# Patient Record
Sex: Female | Born: 1955 | Race: White | Hispanic: No
Health system: Northeastern US, Academic
[De-identification: ages and names within clinical notes are randomized; demographics above are authoritative.]

---

## 2020-01-24 ENCOUNTER — Ambulatory Visit: Admit: 2020-01-24 | Payer: PRIVATE HEALTH INSURANCE | Attending: Medical Oncology

## 2020-01-25 DIAGNOSIS — Z853 Personal history of malignant neoplasm of breast: Secondary | ICD-10-CM

## 2020-01-25 NOTE — Progress Notes
Re: Sydney Pope (Oct 05, 1955)MRN: [id number] Provider: Donetta Potts, MDDate of service: 7/14/2021FOLLOWUP NOTEReason for visit: Follow up Completed neoadjuvant chemotherapy (pertuzumab held due to allergic reaction) on clinical trial HIC [id number] with a pathologic complete response. 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 Concerning round lymph node in the left axillary tail also noted. Both biopsied. - 04/28/2013 Biopsy, left breast, 1 o'clock: Infiltrating duct carcinoma, poorly differentiated (tub 3, nuc 3, mit 2), maximum size on one fragment 0.9cm. ER < 1% (Negative), PR < 1% (Negative), AR 60% (1+ to 3+ positive). HER2 3+ by IHC and positive by FISH, ratio 15.0, average HER2 copy number 40.0. Ki-67 35%.- 04/28/2013 Biopsy, left axilla: Fragments of lymph node involved by metastatic carcinoma which is similar in morphology to that seen in breast biopsy, maximum size in one fragment 0.8cm; no extracapsular extension seen. Tumor is GATA3+, S100 negative. - 05/07/2013 Initial surgery visit. - 05/08/2013 Initial medical oncology visit. - 05/16/2013 [place] chest/abd/pelvis: IMPRESSION: 1. Left intramammary mass with associated left axillary lymphadenopathy. 2. 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 Estimated ejection fraction by 3D volumetric analysis is 67 %. Mild diastolic dysfunction. Mild mitral and tricuspid regurgitation. No significant pericardial effusion. No prior studies for comparison. - 05/18/2013 Returns to clinic. Consented to [facility] #[id number], examining paclitaxel + Herceptin + pertuzumab followed by FEC75 + Herceptin + pertuzumab.- 05/22/2013 Cycle 1 Day 1 Taxol/H/P, 05/29/2013 day 8, 06/05/2013 day 15- 06/12/2013 Returns to clinic. Anal mucositis + ? Herpes outbreak, started on Valtrex- 06/12/2013 Cycle 2 Day 1 Taxol/H/P, 06/19/2013 day 8, 06/26/2013 day 15- 07/03/2013 Returns to clinic. Cycle 3 Day 1 Taxol/H/P, day 8 12/29 and day 15 07/17/13- 07/24/2013 Cycle 4 Day 1; had reaction to pertuzumab, requiring overnight observation in the hospital. Did not receive Taxol or Herceptin. - 08/01/2013 Day 8 Taxol/Herceptin, 1/26 Day 15, 2/2 Day 22. Pertuzumab discontinued given concern for future anaphylaxis. - 08/18/2013 Ultrasound: Index mass (biopsied IDC) at left 1:00 has decreased in size. Previously biopsied metastatic left axillary lymph node has slightly decreased in size.- 08/18/2013 Echocardiogram: Normal left ventricular size, wall thickness and systolic function. Estimated ejection fraction estimated by 3D volumetric analysis is 69 %. Mild diastolic dysfunction. Normal right ventricular size and systolic function. Mild left atrial dilatation.Mild mitral, tricuspid and pulmonary regurgitation. No significant pericardial effusion. No significant chance compared to the study dated 05/18/2013.- 08/21/2013 Cycle 1 of FEC/H2/22/14-2/24/14: Inpatient admission for neutropenic fever and mouth sores. -09/11/13: cycle 2 of FEC/H. Maintained dose, added Neulasta.-10/02/13: cycle 3 of FEC/H4/6/15-10/18/13: Admission for toe infection. Needed left total nail avulsion.- 10/23/13: cycle 4 of FEC/H- 11/17/2013 Left lumpectomy/axillary dissection: Complete pathologic response- 02/09/14: completed adjuvant radiation treatment with Dr.McGibbon- 04/30/2014 Last dose of adjuvant Herceptin. INTERIM HISTORYHere for follow up. Just moved to [place]. Generally feeling well. Overall doing well. No breast related issues. No new headaches, visual changes. Due for a new prescription. Vaginal dryness is not an issue with Premarin. She lives in [place] for winter and is  here now for summer. No unusual aches and pains. Mood is good. Otherwise feeling well.Review of SystemsA comprehensive 12-point review of systems was queried and is otherwise negative for fevers, headaches, double vision, dizziness, cough, shortness of breath, chest pain, abdominal pain, nausea/vomiting, urinary symptoms, swelling of extremities. No new onset of bone or joint pain.ECOG Performance Status: 0PAST MEDICAL HX: [name]  has a past medical history of Breast cancer (HC Code), Diverticulitis, and Radiation.PAST SURGICAL HX: She  has a past surgical history that includes Splenectomy, total (1963); Foot surgery; Sinus surgery; and Breast surgery (Left, 11/17/13).ALLERGIES: Perjeta [pertuzumab], Ciprofloxacin, and Flagyl [metronidazole]MEDICATIONS: Current Outpatient Medications: ?  acetaminophen, 650 mg, Oral, Q6H PRN?  black cohosh, Take by mouth.?  CALCIUM CARB/VIT D3/MINERALS (CALCIUM-VITAMIN D ORAL), Take by mouth. Start date 2004?  fluocinonide, fluocinonide 0.05 % topical solution?  multivitamin, 1 tablet, Oral, Daily?  Premarin, ?  Benay Spice, FAMILY HX: Her family history includes BRCA 1/2 in her sister; Breast cancer (age of onset: 61) in her paternal aunt and paternal grandmother; Prostate cancer (age of onset: 89) in her father; Uterine cancer (age of onset: 1) in her maternal grandmother.SOCIAL HX: Sydney Pope  reports that she has never smoked. She has never used smokeless tobacco. She reports current alcohol use. She reports that she does not use drugs.PHYSICAL EXAM:BP: 137/75, Pulse: 61, Temp: 98.1 ?F (36.7 ?C), Temp src: Temporal, Resp: 20, SpO2: 100 %, Pain Score: Zero, Height: 5' 3 (1.6 m), Weight: 58.2 kg, Weight (lbs): 128.3, BMI (Calculated): 22.8General: Well appearing female in no acute distress.Oral mucosa is clear. Sclera anicteric. No oral lesions.No palpable cervical, supraclavicular or infraclavicular lymph nodes.  Lungs: Clear to auscultation.Heart: Regular rate and rhythm.Abdomen: Soft and non tender without any appreciable organomegaly.Extremities: No swelling.Musculoskeletal: No tenderness to spine on palpation.Skin: No evidence of rash in exposed parts of the body.Psychiatric: She has a normal mood and affect. Breasts: No palpable axillary adenopathy.Left breast: No suspicious findings, lumpectomy scar without any suspicious findings. Right breast: No suspicious findings. Data Review:Recent ImagingMammography Screening Tomo Bilateral (bh Yh Sr)Result Date: 04/08/2019#[id number] - MAMMO SCREENING TOMO BILATERAL: 04/07/2019 CLINICAL: Screening, S/P left breast cancer (lumpectomy).  Digital breast tomosynthesis was performed and used in the interpretation of images.  C-View (synthesized 2D) mammography was utilized.  Comparison is made to exams dated:  12/02/2017 mammogram, 12/05/2015 mammogram - [facility], 04/21/2013 mammogram - [facility], and 04/07/2013 mammogram.  There are scattered areas of fibroglandular density.  There are benign surgical clips and post surgical scar in the left breast.  There are possible grouped calcifications in the left breast posterior  depth lateral region at the surgical scar seen on the craniocaudal view only.  No other significant masses, calcifications, or other findings are seen in either breast.  IMPRESSION: INCOMPLETE: NEEDS ADDITIONAL IMAGING EVALUATION - BI-RADS 0 The possible grouped calcifications in the left breast appear indeterminate.  Magnification views are recommended.  The patient was informed by letter of the results.  Bernarda Caffey M.D.          me/:04/08/2019 12:21:09  Imaging Technologist(s): Scarlette Ar, [facility] [place] letter sent: Screening Recall  Mammogram BI-RADS: 0 Incomplete: Needs Additional Imaging EvaluationMammography Diagnostic LeftResult Date: 04/13/2019#[id number] - MAMMO DIAGNOSTIC LEFT: 04/13/2019 CLINICAL: Left breast carcinoma status post lumpectomy with call back for calcifications at the lumpectomy site.  Current study was also evaluated with a Computer Aided Detection (CAD)  system.  Comparison is made to exams dated:  04/07/2019 mammogram - [facility], 12/02/2017 mammogram, 11/26/2016 mammogram, 12/05/2015 mammogram, and 12/05/2014 mammogram - [facility]  There are scattered areas of fibroglandular density in the left breast.  There are a few round and rim calcifications in the left breast at 1 o'clock posterior depth, located adjacent to the surgical clips at the  lumpectomy site. No other significant masses or calcifications are seen in the breast.  IMPRESSION: BENIGN - BI-RADS 2 Few benign calcifications in the left breast.  There is no mammographic evidence of malignancy. Return to routine bilateral mammogram screening schedule in 1 year is recommended.  The exam was reviewed by a staff physician.  The patient was informed by letter of the results.  Jairo Ben M.D.          Jacinta Shoe M.D. ks,dh/:04/13/2019 13:34:58  Imaging Technologist(s): Scarlette Ar, [facility] letter sent: 1 Year Screening  Mammogram BI-RADS: 2 BenignPathology:11/17/13: Left partial mastectomy with left axillary dissection: No residual invasive or insitu cancer. 15 lymph nodes negative. Labs:No recent labs. ASSESSMENT/PLAN:#. Stage II HER2 positive, ER/PR negative breast cancer:  Presented with 2.1cm left axillary mass. Staging negative for metastatic disease. Completed neoadjuvant chemotherapy on protocol with Taxol, Herceptin, and  Pertuzumab followed by FEC/Herceptin (pertuzumab discontinued due to anaphylactic reaction), with complete pathologic complete response. She completed adjuvant Herceptin 04/2014. #. No findings concerning for recurrence today. #. Breast imaging benign 03/2019 with additional views 04/2019, annual follow up due 04/2020, ordered today. #. Symptom management:- Hot flashes:she is on an OTC estroven (black cohosh)- Lifestyle: Remains active.  - Vaginal dryness/stinging: On premarin through GYN. #. Lower back stiffness/pain: No issues reported today.#. Social Support. Supportive partner, Para March. Mood is good. - Lives in [place] half the year. #. Follow up: RTC 1 year with me. Call sooner for questions or concerns. Reportable symptoms reviewed. On the day of this patient's encounter, a total of >20 minutes was personally spent by me.  This does not include any resident/fellow teaching time, or any time spent performing a procedural service.Donetta Potts, MDMedical Oncology AttendingSection of Breast Medical OncologyPager [phone]

## 2020-04-15 ENCOUNTER — Inpatient Hospital Stay: Admit: 2020-04-15 | Discharge: 2020-04-15 | Payer: PRIVATE HEALTH INSURANCE

## 2020-04-15 DIAGNOSIS — Z1231 Encounter for screening mammogram for malignant neoplasm of breast: Secondary | ICD-10-CM

## 2020-04-15 DIAGNOSIS — Z853 Personal history of malignant neoplasm of breast: Secondary | ICD-10-CM

## 2021-01-20 ENCOUNTER — Ambulatory Visit: Admit: 2021-01-20 | Payer: PRIVATE HEALTH INSURANCE | Attending: Medical Oncology

## 2021-01-20 DIAGNOSIS — Z853 Personal history of malignant neoplasm of breast: Secondary | ICD-10-CM

## 2021-01-20 NOTE — Progress Notes
Re: Sydney Pope (1955/08/05)MRN: [id number] Provider: Donetta Potts, MDDate of service: 7/11/2022FOLLOWUP NOTEReason for visit: Follow up Completed neoadjuvant chemotherapy (pertuzumab held due to allergic reaction) on clinical trial HIC [id number] with a pathologic complete response. 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 Concerning round lymph node in the left axillary tail also noted. Both biopsied. - 04/28/2013 Biopsy, left breast, 1 o'clock: Infiltrating duct carcinoma, poorly differentiated (tub 3, nuc 3, mit 2), maximum size on one fragment 0.9cm. ER < 1% (Negative), PR < 1% (Negative), AR 60% (1+ to 3+ positive). HER2 3+ by IHC and positive by FISH, ratio 15.0, average HER2 copy number 40.0. Ki-67 35%.- 04/28/2013 Biopsy, left axilla: Fragments of lymph node involved by metastatic carcinoma which is similar in morphology to that seen in breast biopsy, maximum size in one fragment 0.8cm; no extracapsular extension seen. Tumor is GATA3+, S100 negative. - 05/07/2013 Initial surgery visit. - 05/08/2013 Initial medical oncology visit. - 05/16/2013 [place] chest/abd/pelvis: IMPRESSION: 1. Left intramammary mass with associated left axillary lymphadenopathy. 2. 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 Estimated ejection fraction by 3D volumetric analysis is 67 %. Mild diastolic dysfunction. Mild mitral and tricuspid regurgitation. No significant pericardial effusion. No prior studies for comparison. - 05/18/2013 Returns to clinic. Consented to [facility] #[id number], examining paclitaxel + Herceptin + pertuzumab followed by FEC75 + Herceptin + pertuzumab.- 05/22/2013 Cycle 1 Day 1 Taxol/H/P, 05/29/2013 day 8, 06/05/2013 day 15- 06/12/2013 Returns to clinic. Anal mucositis + ? Herpes outbreak, started on Valtrex- 06/12/2013 Cycle 2 Day 1 Taxol/H/P, 06/19/2013 day 8, 06/26/2013 day 15- 07/03/2013 Returns to clinic. Cycle 3 Day 1 Taxol/H/P, day 8 12/29 and day 15 07/17/13- 07/24/2013 Cycle 4 Day 1; had reaction to pertuzumab, requiring overnight observation in the hospital. Did not receive Taxol or Herceptin. - 08/01/2013 Day 8 Taxol/Herceptin, 1/26 Day 15, 2/2 Day 22. Pertuzumab discontinued given concern for future anaphylaxis. - 08/18/2013 Ultrasound: Index mass (biopsied IDC) at left 1:00 has decreased in size. Previously biopsied metastatic left axillary lymph node has slightly decreased in size.- 08/18/2013 Echocardiogram: Normal left ventricular size, wall thickness and systolic function. Estimated ejection fraction estimated by 3D volumetric analysis is 69 %. Mild diastolic dysfunction. Normal right ventricular size and systolic function. Mild left atrial dilatation.Mild mitral, tricuspid and pulmonary regurgitation. No significant pericardial effusion. No significant chance compared to the study dated 05/18/2013.- 08/21/2013 Cycle 1 of FEC/H2/22/14-2/24/14: Inpatient admission for neutropenic fever and mouth sores. -09/11/13: cycle 2 of FEC/H. Maintained dose, added Neulasta.-10/02/13: cycle 3 of FEC/H4/6/15-10/18/13: Admission for toe infection. Needed left total nail avulsion.- 10/23/13: cycle 4 of FEC/H- 11/17/2013 Left lumpectomy/axillary dissection: Complete pathologic response- 02/09/14: completed adjuvant radiation treatment with Dr.McGibbon- 04/30/2014 Last dose of adjuvant Herceptin. INTERIM HISTORYHere for follow up. Just moved to [place]. Generally feeling well. Occasional tingling in her left arm when she sleeps, but it is getting better. No breast complaints. Getting nail fungus addressed by a podiatrist. She had a bad bone density test and she  has osteoporosis, so she is seeing an endocrinologist.Vaginal dryness is not an issue with Premarin. She lives in [place] for winter and is here now for summer. No unusual aches and pains. Mood is good. Otherwise feeling well.Review of SystemsA comprehensive 12-point review of systems was queried and is otherwise negative for fevers, headaches, double vision, dizziness, cough, shortness of breath, chest pain, abdominal pain, nausea/vomiting, urinary symptoms, swelling of extremities. No new onset of bone or joint pain.ECOG Performance Status: 0PAST MEDICAL HX: Dashea  has a past medical history of Breast cancer (HC Code), Diverticulitis, and Radiation.PAST SURGICAL HX: She  has a past surgical history that includes Splenectomy, total (1963); Foot surgery; Sinus surgery; and Breast surgery (Left, 11/17/13).ALLERGIES: Perjeta [pertuzumab], Ciprofloxacin, and Flagyl [metronidazole]MEDICATIONS: Current Outpatient Medications: ?  acetaminophen, 650 mg, Oral, Q6H PRN?  black cohosh, Take by mouth.?  CALCIUM CARB/VIT D3/MINERALS (CALCIUM-VITAMIN D ORAL), Take by mouth. Start date 2004?  fluocinonide, fluocinonide 0.05 % topical solution?  multivitamin, 1 tablet, Oral, Daily?  Premarin, ?  Benay Spice, FAMILY HX: Her family history includes BRCA 1/2 in her sister; Breast cancer (age of onset: 24) in her paternal aunt and paternal grandmother; Prostate cancer (age of onset: 82) in her father; Uterine cancer (age of onset: 19) in her maternal grandmother.SOCIAL HX: Bitha  reports that she has never smoked. She has never used smokeless tobacco. She reports current alcohol use. She reports that she does not use drugs.PHYSICAL EXAM:BP: 130/65 (01/20/2021  2:04 PM), Pulse: (!) 56 (01/20/2021  2:04 PM), Temp: 97.4 ?F (36.3 ?C) (01/20/2021  2:04 PM), Resp: 19 (01/20/2021  2:04 PM), SpO2: 99 % (01/20/2021  2:04 PM), Pain Score:   0 - No pain (01/20/2021  2:04 PM), Weight: 56.9 kg (01/20/2021  2:04 PM), Weight (lbs): 125.5 (01/20/2021  2:04 PM)General: Well appearing female in no acute distress.Oral mucosa is clear. Sclera anicteric. No oral lesions.No palpable cervical, supraclavicular or infraclavicular lymph nodes.  Lungs: Clear to auscultation.Heart: Regular rate and rhythm.Abdomen: Soft and non tender without any appreciable organomegaly.Extremities: No swelling.Musculoskeletal: No tenderness to spine on palpation.Skin: No evidence of rash in exposed parts of the body.Psychiatric: She has a normal mood and affect. Breasts: No palpable axillary adenopathy.Left breast: No suspicious findings, lumpectomy scar without any suspicious findings. Right breast: No suspicious findings. Data Review:Recent ImagingMammography Screening Tomo Bilateral (bh Yh Sr)Result Date: 04/08/2019#[id number] - MAMMO SCREENING TOMO BILATERAL: 04/07/2019 CLINICAL: Screening, S/P left breast cancer (lumpectomy).  Digital breast tomosynthesis was performed and used in the interpretation of images.  C-View (synthesized 2D) mammography was utilized.  Comparison is made to exams dated:  12/02/2017 mammogram, 12/05/2015 mammogram - [facility], 04/21/2013 mammogram - [facility], and 04/07/2013 mammogram.  There are scattered areas of fibroglandular density.  There are benign surgical clips and post surgical scar in the left breast.  There are possible grouped calcifications in the left breast posterior  depth lateral region at the surgical scar seen on the craniocaudal view only.  No other significant masses, calcifications, or other findings are seen in either breast.  IMPRESSION: INCOMPLETE: NEEDS ADDITIONAL IMAGING EVALUATION - BI-RADS 0 The possible grouped calcifications in the left breast appear indeterminate.  Magnification views are recommended.  The patient was informed by letter of the results.  Bernarda Caffey M.D.          me/:04/08/2019 12:21:09  Imaging Technologist(s): Scarlette Ar, [facility] [place] letter sent: Screening Recall  Mammogram BI-RADS: 0 Incomplete: Needs Additional Imaging EvaluationMammography Diagnostic LeftResult Date:  04/13/2019#[id number] - MAMMO DIAGNOSTIC LEFT: 04/13/2019 CLINICAL: Left breast carcinoma status post lumpectomy with call back for calcifications at the lumpectomy site.  Current study was also evaluated with a Computer Aided Detection (CAD)  system.  Comparison is made to exams dated:  04/07/2019 mammogram - [facility], 12/02/2017 mammogram, 11/26/2016 mammogram, 12/05/2015 mammogram, and 12/05/2014 mammogram - [facility].  There are scattered areas of fibroglandular density in the left breast.  There are a few round and rim calcifications in the left breast at 1 o'clock posterior depth, located adjacent to the surgical clips at the  lumpectomy site. No other significant masses or calcifications are seen in the breast.  IMPRESSION: BENIGN - BI-RADS 2 Few benign calcifications in the left breast.  There is no mammographic evidence of malignancy. Return to routine bilateral mammogram screening schedule in 1 year is recommended.  The exam was reviewed by a staff physician.  The patient was informed by letter of the results.  Jairo Ben M.D.          Jacinta Shoe M.D. ks,dh/:04/13/2019 13:34:58  Imaging Technologist(s): Scarlette Ar, [facility] letter sent: 1 Year Screening  Mammogram BI-RADS: 2 BenignPathology:11/17/13: Left partial mastectomy with left axillary dissection: No residual invasive or insitu cancer. 15 lymph nodes negative. Labs:No recent labs. ASSESSMENT/PLAN:#. Stage II HER2 positive, ER/PR negative breast cancer:  Presented with 2.1cm left axillary mass. Staging negative for metastatic disease. Completed neoadjuvant chemotherapy on protocol with Taxol, Herceptin, and  Pertuzumab followed by FEC/Herceptin (pertuzumab discontinued due to anaphylactic reaction), with complete pathologic complete response. She completed adjuvant Herceptin 04/2014. #. No findings concerning for recurrence today. #. Breast imaging benign 04/2020, annual follow up due 04/2021, ordered today. #. Osteoporosis: Will meet with endocrinologist soon to discuss anti-osteoporotic agents. #. Symptom management:- Hot flashes:she is on an OTC estroven (black cohosh)- Lifestyle: Remains active.  - Vaginal dryness/stinging: On premarin through GYN. #. Lower back stiffness/pain: No issues reported today.#. Social Support. Supportive partner, Para March. Mood is good. - Lives in [place] half the year. #. Follow up: RTC 1 year with me. Call sooner for questions or concerns. Reportable symptoms reviewed. On the day of this patient's encounter, a total of >20 minutes was personally spent by me.  This does not include any resident/fellow teaching time, or any time spent performing a procedural service.Donetta Potts, MDMedical Oncology AttendingSection of Breast Medical OncologyPager [phone]

## 2021-01-22 ENCOUNTER — Encounter: Admit: 2021-01-22 | Payer: PRIVATE HEALTH INSURANCE | Attending: Medical Oncology

## 2021-01-22 ENCOUNTER — Telehealth: Admit: 2021-01-22 | Payer: PRIVATE HEALTH INSURANCE | Attending: Medical Oncology

## 2021-01-22 DIAGNOSIS — Z853 Personal history of malignant neoplasm of breast: Secondary | ICD-10-CM

## 2021-01-22 NOTE — Telephone Encounter
Patient calling to have her mammp order fax to [phone] as the [facility] does not have nothing before she leaves [place] fot the season. Patient CB # [phone]

## 2021-01-22 NOTE — Telephone Encounter
Orders faxed. Electronically Signed by Marin Olp, RN, January 22, 2021

## 2021-06-10 ENCOUNTER — Telehealth: Admit: 2021-06-10 | Payer: PRIVATE HEALTH INSURANCE | Attending: Medical Oncology

## 2021-06-10 NOTE — Telephone Encounter
Patient looking to speak with team about cream her gynecologist would like to put her on. CB# [phone]

## 2021-06-10 NOTE — Telephone Encounter
Called pt back but no answer.  LVM to please call clinic at her convenience.

## 2021-06-11 ENCOUNTER — Telehealth: Admit: 2021-06-11 | Payer: PRIVATE HEALTH INSURANCE

## 2021-06-11 NOTE — Telephone Encounter
Patient called and stated that she has been using premarin cream. Intercourse is still painful. After speaking with GYN, recommendations were made to increase her frequency of how often she uses premarin cream or start a new medication called Imvexxy. Patient wants to know what option is best for her due to her history of breast cancer. I advised patient that I would speak to the team and call her back. Electronically Signed by Marin Olp, RN, June 11, 2021

## 2021-06-11 NOTE — Telephone Encounter
Called patient to advise the Imvexxy is okay to use. Appreciated the call. Electronically Signed by Marin Olp, RN, June 11, 2021

## 2022-01-21 ENCOUNTER — Ambulatory Visit: Admit: 2022-01-21 | Payer: PRIVATE HEALTH INSURANCE | Attending: Oncology

## 2022-01-21 DIAGNOSIS — Z08 Encounter for follow-up examination after completed treatment for malignant neoplasm: Secondary | ICD-10-CM

## 2022-01-21 DIAGNOSIS — Z853 Personal history of malignant neoplasm of breast: Secondary | ICD-10-CM

## 2022-01-21 NOTE — Progress Notes
Re: Sydney Pope (05-22-1956)MRN: [id number] Provider: Elyse Hsu, APRNDate of service: 7/12/2023FOLLOWUP NOTEReason for visit: Follow up Completed neoadjuvant chemotherapy (pertuzumab held due to allergic reaction) on clinical trial HIC [id number] with a pathologic complete response. 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 Concerning round lymph node in the left axillary tail also noted. Both biopsied. - 04/28/2013 Biopsy, left breast, 1 o'clock: Infiltrating duct carcinoma, poorly differentiated (tub 3, nuc 3, mit 2), maximum size on one fragment 0.9cm. ER < 1% (Negative), PR < 1% (Negative), AR 60% (1+ to 3+ positive). HER2 3+ by IHC and positive by FISH, ratio 15.0, average HER2 copy number 40.0. Ki-67 35%.- 04/28/2013 Biopsy, left axilla: Fragments of lymph node involved by metastatic carcinoma which is similar in morphology to that seen in breast biopsy, maximum size in one fragment 0.8cm; no extracapsular extension seen. Tumor is GATA3+, S100 negative. - 05/07/2013 Initial surgery visit. - 05/08/2013 Initial medical oncology visit. - 05/16/2013 [place] chest/abd/pelvis: IMPRESSION: 1. Left intramammary mass with associated left axillary lymphadenopathy. 2. 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 Estimated ejection fraction by 3D volumetric analysis is 67 %. Mild diastolic dysfunction. Mild mitral and tricuspid regurgitation. No significant pericardial effusion. No prior studies for comparison. - 05/18/2013 Returns to clinic. Consented to [facility] #[id number], examining paclitaxel + Herceptin + pertuzumab followed by FEC75 + Herceptin + pertuzumab.- 05/22/2013 Cycle 1 Day 1 Taxol/H/P, 05/29/2013 day 8, 06/05/2013 day 15- 06/12/2013 Returns to clinic. Anal mucositis + ? Herpes outbreak, started on Valtrex- 06/12/2013 Cycle 2 Day 1 Taxol/H/P, 06/19/2013 day 8, 06/26/2013 day 15- 07/03/2013 Returns to clinic. Cycle 3 Day 1 Taxol/H/P, day 8 12/29 and day 15 07/17/13- 07/24/2013 Cycle 4 Day 1; had reaction to pertuzumab, requiring overnight observation in the hospital. Did not receive Taxol or Herceptin. - 08/01/2013 Day 8 Taxol/Herceptin, 1/26 Day 15, 2/2 Day 22. Pertuzumab discontinued given concern for future anaphylaxis. - 08/18/2013 Ultrasound: Index mass (biopsied IDC) at left 1:00 has decreased in size. Previously biopsied metastatic left axillary lymph node has slightly decreased in size.- 08/18/2013 Echocardiogram: Normal left ventricular size, wall thickness and systolic function. Estimated ejection fraction estimated by 3D volumetric analysis is 69 %. Mild diastolic dysfunction. Normal right ventricular size and systolic function. Mild left atrial dilatation.Mild mitral, tricuspid and pulmonary regurgitation. No significant pericardial effusion. No significant chance compared to the study dated 05/18/2013.- 08/21/2013 Cycle 1 of FEC/H2/22/14-2/24/14: Inpatient admission for neutropenic fever and mouth sores. -09/11/13: cycle 2 of FEC/H. Maintained dose, added Neulasta.-10/02/13: cycle 3 of FEC/H4/6/15-10/18/13: Admission for toe infection. Needed left total nail avulsion.- 10/23/13: cycle 4 of FEC/H- 11/17/2013 Left lumpectomy/axillary dissection: Complete pathologic response- 02/09/14: completed adjuvant radiation treatment with Dr.McGibbon- 04/30/2014 Last dose of adjuvant Herceptin. INTERIM HISTORYLast seen in clinic July 2022Here for follow up. Summer in [place]. Overall feeling well. No breast related issues. Seen endocrinologist for osteoporosis. Plan is to watch it. Breast imaigng is in October. Not scheduled. Toenails:n anti fungal treatment are improving[place]. Vaginal dryness : on Premarin. No unusual aches and pains. Mood is good. Review of SystemsA comprehensive 12-point review of systems was queried and is otherwise negative for fevers, headaches, double vision, dizziness, cough, shortness of breath, chest pain, abdominal pain, nausea/vomiting, bowel changes,  urinary symptoms, swelling of extremities. No new onset of bone or joint pain.ECOG Performance Status: 0PAST MEDICAL HX: Riata  has a past medical history of Breast cancer (HC Code), Diverticulitis, and Radiation.PAST SURGICAL HX: She  has a past surgical history that includes Splenectomy, total (1963); Foot surgery; Sinus surgery; and Breast surgery (Left, 11/17/13).ALLERGIES: Perjeta [pertuzumab], Ciprofloxacin, and Flagyl [metronidazole]MEDICATIONS: Current Medications Medication Sig ? acetaminophen (TYLENOL) 325 MG tablet Take 650 mg by mouth every 6 (six) hours as needed. Start date December 2014 ? black cohosh 20 mg Tab Take by mouth. ? CALCIUM CARB/VIT D3/MINERALS (CALCIUM-VITAMIN D ORAL) Take by mouth. Start date 2004 ? PREMARIN 0.625 mg/gram Crea  ? XIIDRA 5 % Dpet  FAMILY HX: Her family history includes BRCA 1/2 in her sister; Breast cancer (age of onset: 78) in her paternal aunt and paternal grandmother; Prostate cancer (age of onset: 35) in her father; Uterine cancer (age of onset: 75) in her maternal grandmother.SOCIAL HX: Nicholle  reports that she has never smoked. She has never used smokeless tobacco. She reports current alcohol use. She reports that she does not use drugs. 3-4 glasses of wine per week. PHYSICAL EXAM:BP: 138/75 (01/21/2022  9:56 AM), Pulse: 60 (01/21/2022  9:56 AM), Temp: 98.6 ?F (37 ?C) (01/21/2022  9:56 AM), Temp src: Temporal (01/21/2022  9:56 AM), Resp: 14 (01/21/2022  9:56 AM), SpO2: 99 % (01/21/2022  9:56 AM), Pain Score: Zero (01/21/2022  9:56 AM), Height: 5' 2.6 (1.59 m) (01/21/2022  9:56 AM), Weight: 55.8 kg (01/21/2022  9:56 AM), Weight (lbs): 123 (01/21/2022  9:56 AM), BMI (Calculated): 22.1 (01/21/2022  9:56 AM)General: Well appearing female in no acute distress.Oral mucosa is clear. Sclera anicteric. No palpable cervical, supraclavicular or infraclavicular lymph nodes.  Lungs: Clear to auscultation.Heart: Regular rate and rhythm.Abdomen: Soft and non tender without any appreciable organomegaly.Extremities: No swelling.Musculoskeletal: No tenderness to spine on palpation.Skin: No evidence of rash in exposed parts of the body.Psychiatric: She has a normal mood and affect. Breasts: No palpable axillary adenopathy.Left breast: No suspicious findings, lumpectomy scar without any suspicious findings. Right breast: No suspicious findings. Chaperone: Diane Hydrologist Review:Recent ImagingMammography Screening Tomo Bilateral ([facility])#[id number] - MAMMO SCREENING TOMO BILATERAL: 10/2022BREAST:RightRECOMMENDATION:Annual screening mammogram in 1 yearBREAST:LeftRECOMMENDATION:Annual screening mammogram in 1 yearBIRADS:2, Benign?  Pathology:11/17/13: Left partial mastectomy with left axillary dissection: No residual invasive or insitu cancer. 15 lymph nodes negative. Labs:No recent labs. ASSESSMENT/PLAN:#. Stage II HER2 positive, ER/PR negative breast cancer:  Presented with 2.1cm left axillary mass. Staging negative for metastatic disease. Completed neoadjuvant chemotherapy on protocol with Taxol, Herceptin, and  Pertuzumab followed by FEC/Herceptin (pertuzumab discontinued due to anaphylactic reaction), with complete pathologic complete response. She completed adjuvant Herceptin 04/2014. #. No findings concerning for recurrence today. #. Breast imaging benign 04/2021, annual follow up due 04/2022 [place] and she will call and schedule#. Osteoporosis: Met with endocrinologist, plan is to monitor this.  #. Symptom management:- Lifestyle: Remains active.  - Vaginal dryness/stinging: On premarin through GYN. #.Requests referral to dermatology : skin lesion near thumb, not had skin checkReferral placed. #. Social Support. Supportive partner, Para March. Mood is good. - Lives in [place] half the year. #. Follow up: RTC 1 year Call sooner for questions or  concerns. Reportable symptoms reviewed. Call sooner for questions or concerns. Reportable symptoms reviewed. On the day of this patient's encounter, a total of 30  minutes was personally spent by me.  Orders Placed This Encounter Procedures ? Mammography Screening Tomo Bilateral ? Ambulatory referral to Dermatology

## 2022-04-20 ENCOUNTER — Telehealth: Admit: 2022-04-20 | Payer: PRIVATE HEALTH INSURANCE | Attending: Medical Oncology

## 2022-04-20 NOTE — Telephone Encounter
Patient calling to speak with Cumbola Shi, states she got a call from Andree Moro about a referral that was placed by [place]. Patient states she does not know what it is about and requesting a call. CB# [phone]

## 2022-04-20 NOTE — Telephone Encounter
Called pt back.  Referral was for TBSE and attention to skin around thumb.  Pt states these are plantars warts.  [place] so she made the appt for 11/2022.

## 2022-04-22 ENCOUNTER — Encounter: Admit: 2022-04-22 | Payer: PRIVATE HEALTH INSURANCE | Attending: Vascular and Interventional Radiology

## 2022-12-03 ENCOUNTER — Ambulatory Visit
Admit: 2022-12-03 | Payer: PRIVATE HEALTH INSURANCE | Attending: Student in an Organized Health Care Education/Training Program

## 2022-12-03 DIAGNOSIS — B079 Viral wart, unspecified: Secondary | ICD-10-CM

## 2022-12-03 DIAGNOSIS — D485 Neoplasm of uncertain behavior of skin: Secondary | ICD-10-CM

## 2022-12-03 DIAGNOSIS — D229 Melanocytic nevi, unspecified: Secondary | ICD-10-CM

## 2022-12-03 DIAGNOSIS — L814 Other melanin hyperpigmentation: Secondary | ICD-10-CM

## 2022-12-03 DIAGNOSIS — L821 Other seborrheic keratosis: Secondary | ICD-10-CM

## 2022-12-03 DIAGNOSIS — Z1283 Encounter for screening for malignant neoplasm of skin: Secondary | ICD-10-CM

## 2022-12-03 DIAGNOSIS — D1801 Hemangioma of skin and subcutaneous tissue: Secondary | ICD-10-CM

## 2022-12-03 NOTE — Patient Instructions
BIOPSY WOUND CARE INSTRUCTIONSLeave initial bandage on for 24 hours. Keep clean/dry. Then daily, remove the bandage and gently cleanse wound with soap and water. Then apply a small amount of Vaseline or Aquaphor ointment and cover with a new band-aid. Do this daily until healed, or about 1 week.Do not use an over-the-counter (OTC) antibiotic ointment unless recommended by your doctor.If you experience significant pain, redness, malodorous drainage or fevers, you should call our office. You will be contacted with your biopsy results in about 1 week by phone, mail, or MyChart.  If you do not hear from [place] in 2 weeks please call our office.-------------------------------------------------------------------------------------------------------------------------------------For the wart:Today in the clinic during the visit we did:- Destruct with curettage and liquid nitrogenAt home you will:- Starting in 1 week, over-the-counter salicylic acid pads to be applied nightly as tolerated and covered with gentle paring via a pumice stone or emery board daily.- Hot water soaks at 110?F for 30 minutes for 3 days in a row then repeat and 2 weeks.  Recommend use of a candy thermometer to ensure correct temperature and avoid overheating the skin. May also use a Sunbeam heating pad on low (equals 110? F) for the same duration and frequency.- Duct tape application to the wart until falls off, then replace (can be used to secure salicyclic acid pads); can combine with gentle paring via pumice stone or emery board -------------------------------------------------------------------------------------------------------------------------------------[facility]  Kathlen Mody , MD[address] [place], [place] [id number] Protection RecommendationsUse a daily sunscreen that is labeled as broad-spectrum which covers both UVA and UVB sun rays. The sunscreen should be a least SPF 30.Reapply the sunscreen every 2 hours when outdoors.Use at least one fluid ounce (one shot glass full) of sunscreen for every application to cover all sun-exposed areas.Don't forget to apply sunscreen to your face, ears, and neck, as well as your arms and legs if they are not covered by clothing.Seek shade between 10 a.m. and 4 p.m., which are the peak hours of UVB exposure.Wear lightweight long-sleeved shirts and pants when possible. There are now numerous clothing lines that contain SPF protection built into the clothing.Wear a wide-brimmed hat and sunglasses whenever possible.Sunscreens can contain chemical sunscreens or physical blockers. Physical blockers include titanium dioxide and zinc oxide. Look at the ingredients on the bottle label to see which type of sunscreen is contained in that bottle.If using spray sunscreen, be sure that you are applying an even layer on all sun-exposed skin and rub in the sunscreen after spraying.Perform self skin-examinations once a month to look for any new or changing lesions. If there is any concern, call our dermatology clinic to make an appointment for an exam.Do NOT use tanning beds which increase risk of skin cancer.* Patients with a history of sun damage or prior history of skin cancer are at increased risk for developing new skin cancers or precancerous skin changes in the future and should undergo routine skin examinations by a Dermatologist. In addition, self-skin examinations are recommended as outlined below:How to examine your skin for potential skin cancersWe are suggesting that you perform a monthly skin cancer check. Check your skin for any new spots which are changing in color, size, or shape.We suggest the following steps to systematically check your skin: It is helpful to use a full length mirror and a hand mirror to look at your skin.Examine your face, especially the nose, lips, mouth, and ears - front and back. Check your hands carefully: palms and backs, between the fingers and under the fingernails.  Continue up your wrists to examine both front and back of your forearms.Then begin at your elbows and scan all sides of your upper arms and underarms.Next focus on the neck, chest, and trunk. Women should lift their breasts to view the underside.With your back to the full-length mirror, use a hand mirror to inspect the back of your neck, shoulders, upper back, and back of your arms.Still using both mirrors, scan your lower back, buttocks, and backs of both legs.Sit down; prop each leg in turn on the other stool or chair. Check front and sides of both legs, thigh to shin, ankles, tops of feet, between toes and under toenails. Examine soles of feet and heels.Adapted from: http://www.skincancer.org/skin-cancer-information/early-detection/step-by-step-self-examinationMoles (melanocytic nevi, beauty marks)These are collections of pigment cells in the skin. They can be flat or elevated.They are stimulated to grow by sunlight and tanning beds.Melanoma is a cancerous form of a mole that can kill. There are over 8,000 new cases every year in the [place]To distinguish a possible melanoma from a mole use the ABCDE rules:A - Asymmetry - Most moles are symmetric (they look the same on both sides), melanomas do notB - Border irregularity - Most moles have a smooth border, melanomas may have an irregular borderC - Color variegation - Most moles have one or two colors which are symmetric (e.g. darker center lighter around the edges), melanomas may 	have several irregularly placed colors (e.g. brown, black, red, white)D - Diameter - Most moles are less than 6 mm (size of a pencil eraser), melanomas can be larger (or smaller)E - Evolution - Melanoma can grow rapidly so watch out for any rapidly growing spot, even ones without brown color (some melanomas are skin colored and some are red)The growth of hair within moles is normal, is not concerning and it does not increase the risk of melanoma.

## 2022-12-03 NOTE — Progress Notes
DERMATOLOGY: NEW PATIENT VISITCC/Reason for consult: TBSE, LOC thumbReferring provider: Matthew Folks Sydney Pope,*HPI: 67 y.o. female referred for above.Pt reports wart on her thumb. She has a history of warts.Pt reports a history of breast cancer years ago.Patient denies personal history of skin cancer but reports family history of BCC.No other lesions that are bleeding, growing, or painful.ROS: new lumps or bumps, or other skin concerns.PMH/PSH: Past Medical History: Diagnosis Date  Breast cancer (HC Code)   left  Diverticulitis   Radiation  Patient Active Problem List Diagnosis  Malignant neoplasm of left female breast (HC Code)  Allergic reaction  Toe infection  Dystrophic nail  Ingrowing nail  Onychomycosis  Onycholysis  Diverticulitis Medications:Current Outpatient Medications on File Prior to Visit Medication Sig Dispense Refill  CALCIUM CARB/VIT D3/MINERALS (CALCIUM-VITAMIN D ORAL) Take by mouth. Start date 2004    fluocinonide (LIDEX) 0.05 % external solution fluocinonide 0.05 % topical solution    PREMARIN 0.625 mg/gram Crea     XIIDRA 5 % Dpet     acetaminophen (TYLENOL) 325 MG tablet Take 650 mg by mouth every 6 (six) hours as needed. Start date December 2014    black cohosh 20 mg Tab Take by mouth.   No current facility-administered medications on file prior to visit. Allergies: Allergies Allergen Reactions  Perjeta [Pertuzumab] Other (See Comments)   Pt developed SOB, chest pain, hives, lip and tongue swelling.  This was 4th dose with Perjeta  Ciprofloxacin Rash   Pt given this medication at the same time as flagyl- unsure which med caused rash  Flagyl [Metronidazole] Rash   Pt given this medication at the same time as cipro- unsure which med caused rash FH:-Family history of melanoma: none-Family history of NMSC: yes, BCCChart reviewed and updated as appropriate to include PMH, social history, family history, allergies, and current medications.JX:BJYNWGN: well appearing, alert, no acute distressPsych/Neuro: Alert and oriented x 3, normal affect, pleasantSkin:Head/scalp: examinedFace: examinedNeck: examinedChest/axilla/breast: examinedAbdomen: examinedBack: examinedRUE: examinedLUE: examinedRLE: examinedLLE: examinedButtocks: examinedSkin exam notable for:- R1 finger with 2 mm verrucous papule c/w verruca vulgaris (s/p curettage and LN2 x1)- right lower back with 4 mm dark brown/black macule (BIOPSY 1)- right medial buttocks with 6 mm x 7 mm irregular brown macule (BIOPSY 2)- photodistributed tan macules c/w lentigines- scattered brown and skin-colored macules and papules c/w benign nevi- scattered red papules c/w cherry angiomas- scattered stuck on waxy papules/plaques c/w SKs Patient exam or treatment required medical chaperone.The sensitive parts of the examination were performed with chaperone present: Yes; Chaperone Name, Role/Title: Rob Hickman, LPNAssessment and Plan:#Neoplasm of uncertain significance:  - Site A: right lower back ddx r/o AN- Site B: right medial buttocks ddx r/o AN- biopsy performed for diagnosis, procedure note below - photo taken, as above#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 Often multiple months/rounds of treatment are required for treatment and warts can sometimes be very refractory. - Destruct with curettage and liquid nitrogenCurettage and CryotherapyIndication: WartPhysician: Kathlen Mody MDSite(s): R1 fingerNumber of lesions: 1The risk of scarring, blistering, pain, atrophy, and dyspigmentation was explained to the patient.  The benefit of potentially eradicating the lesion was explained to the patient.  The patient agreed to the procedure after being informed of the risks and benefits.  Procedure:  Liquid nitrogen was applied for approximately 7 seconds x  2 cycles.  There  were no complications and the patient tolerated the procedure well.#Seborrheic keratoses- diagnosis and benign nature discussed, reassurance provided#Lentigines- diagnosis and benign nature discussed, reassurance provided#Nevi- diagnosis and benign nature discussed, reassurance provided- patient will monitor lesions and instructed to call for any changes or symptoms #Cherry angiomas - diagnosis and benign nature discussed, reassurance provided#Skin cancer screening - no personal history of skin cancer- No lesions concerning for MM or NMSC on exam today- discussed features of MM and NMSC and patient was instructed to return with any changing, hurting, bleeding or growing lesions. - discussed sun avoidance and protection strategies, handout provided - recommend OTC sunscreen with SPF 30+Contact: Telephone preferredRTC: Return in about 1 year (around 12/03/2023) for 1-2 years for TBSE.Kathlen Mody, MDY[facility] D[facility] Scribed for Lewayne Bunting, MD by Jerel Shepherd, medical scribe Dec 03, 2022  The documentation recorded by the scribe accurately reflects the services I personally performed and the decisions made by me. I reviewed and confirmed all material entered and/or pre-charted by the scribe. SHAVE BIOPSY PROCEDURE NOTERisks including scarring, bleeding, and infection were explained to the patient. Informed consent was obtained and written consent placed in the chart.The skin was prepped with alcohol. The lesion(s) was/were injected with local anesthesia. A shave biopsy was performed. Hemostasis was achieved with aluminum chloride 20% and/or electrocautery. Vaseline was applied and the wound covered with a Bandaid. Wound instructions were reviewed verbally and supplied in written form.The specimen was sent to pathology. Patient will be informed of results as soon as they become available.Time Out DocumentationProcedure Name: shave bxProcedure Time: 12/03/2022 10:28 AMTeam Members Present: Dr. Melburn Hake Out initiated after patient positioned & prior to beginning of procedure: YesName of Patient  & MRUN # or DOB stated and matches ID band or previously confirmed med record number: YesProceduralist states or confirms procedure to be performed: YesProcedural consent is used to verify procedure performed  & matches pt identifiers:YesSite of procedure(s) (with laterality or level) is topically marked per policy & visible after draping:Yes

## 2022-12-08 NOTE — Other
Biopsy from right lower back and right medial buttocks showed benign moles. No further treatment indicated. MyChart message sent to patient. Kathlen Mody, MDY[facility] D[facility]

## 2022-12-10 NOTE — Other
Written by Lewayne Bunting, MD on 12/08/2022 11:48 AM EDT View Full CommentsSeen by patient Sydney Pope on 12/09/2022  9:21 AM

## 2023-01-27 ENCOUNTER — Ambulatory Visit: Admit: 2023-01-27 | Payer: PRIVATE HEALTH INSURANCE | Attending: Medical Oncology

## 2023-02-03 ENCOUNTER — Ambulatory Visit: Admit: 2023-02-03 | Payer: PRIVATE HEALTH INSURANCE | Attending: Medical Oncology

## 2023-02-03 ENCOUNTER — Encounter: Admit: 2023-02-03 | Payer: PRIVATE HEALTH INSURANCE | Attending: Medical Oncology

## 2023-02-03 DIAGNOSIS — K5792 Diverticulitis of intestine, part unspecified, without perforation or abscess without bleeding: Secondary | ICD-10-CM

## 2023-02-03 DIAGNOSIS — B351 Tinea unguium: Secondary | ICD-10-CM

## 2023-02-03 DIAGNOSIS — IMO0001 Radiation: Secondary | ICD-10-CM

## 2023-02-03 DIAGNOSIS — C50912 Malignant neoplasm of unspecified site of left female breast: Secondary | ICD-10-CM

## 2023-02-03 DIAGNOSIS — C50919 Malignant neoplasm of unspecified site of unspecified female breast: Secondary | ICD-10-CM

## 2023-02-03 MED ORDER — CICLOPIROX 0.77 % TOPICAL GEL
0.77 | Freq: Every day | TOPICAL | Status: AC
Start: 2023-02-03 — End: ?

## 2023-02-03 NOTE — Progress Notes
Re: Sydney Pope (31-May-1956)MRN: [id number] Provider: Raoul Pitch, MDDate of service: 7/24/2024FOLLOWUP NOTEReason for visit: Follow up Completed neoadjuvant chemotherapy (pertuzumab held due to allergic reaction) on clinical trial HIC [id number] with a pathologic complete response. 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 Concerning round lymph node in the left axillary tail also noted. Both biopsied. - 04/28/2013 Biopsy, left breast, 1 o'clock: Infiltrating duct carcinoma, poorly differentiated (tub 3, nuc 3, mit 2), maximum size on one fragment 0.9cm. ER < 1% (Negative), PR < 1% (Negative), AR 60% (1+ to 3+ positive). HER2 3+ by IHC and positive by FISH, ratio 15.0, average HER2 copy number 40.0. Ki-67 35%.- 04/28/2013 Biopsy, left axilla: Fragments of lymph node involved by metastatic carcinoma which is similar in morphology to that seen in breast biopsy, maximum size in one fragment 0.8cm; no extracapsular extension seen. Tumor is GATA3+, S100 negative. - 05/07/2013 Initial surgery visit. - 05/08/2013 Initial medical oncology visit. - 05/16/2013 [place] chest/abd/pelvis: IMPRESSION: 1. Left intramammary mass with associated left axillary lymphadenopathy. 2. 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 Estimated ejection fraction by 3D volumetric analysis is 67 %. Mild diastolic dysfunction. Mild mitral and tricuspid regurgitation. No significant pericardial effusion. No prior studies for comparison. - 05/18/2013 Returns to clinic. Consented to [facility] #[id number], examining paclitaxel + Herceptin + pertuzumab followed by FEC75 + Herceptin + pertuzumab.- 05/22/2013 Cycle 1 Day 1 Taxol/H/P, 05/29/2013 day 8, 06/05/2013 day 15- 06/12/2013 Returns to clinic. Anal mucositis + ? Herpes outbreak, started on Valtrex- 06/12/2013 Cycle 2 Day 1 Taxol/H/P, 06/19/2013 day 8, 06/26/2013 day 15- 07/03/2013 Returns to clinic. Cycle 3 Day 1 Taxol/H/P, day 8 12/29 and day 15 07/17/13- 07/24/2013 Cycle 4 Day 1; had reaction to pertuzumab, requiring overnight observation in the hospital. Did not receive Taxol or Herceptin. - 08/01/2013 Day 8 Taxol/Herceptin, 1/26 Day 15, 2/2 Day 22. Pertuzumab discontinued given concern for future anaphylaxis. - 08/18/2013 Ultrasound: Index mass (biopsied IDC) at left 1:00 has decreased in size. Previously biopsied metastatic left axillary lymph node has slightly decreased in size.- 08/18/2013 Echocardiogram: Normal left ventricular size, wall thickness and systolic function. Estimated ejection fraction estimated by 3D volumetric analysis is 69 %. Mild diastolic dysfunction. Normal right ventricular size and systolic function. Mild left atrial dilatation.Mild mitral, tricuspid and pulmonary regurgitation. No significant pericardial effusion. No significant chance compared to the study dated 05/18/2013.- 08/21/2013 Cycle 1 of FEC/H2/22/14-2/24/14: Inpatient admission for neutropenic fever and mouth sores. -09/11/13: cycle 2 of FEC/H. Maintained dose, added Neulasta.-10/02/13: cycle 3 of FEC/H4/6/15-10/18/13: Admission for toe infection. Needed left total nail avulsion.- 10/23/13: cycle 4 of FEC/H- 11/17/2013 Left lumpectomy/axillary dissection: Complete pathologic response- 02/09/14: completed adjuvant radiation treatment with Dr.McGibbon- 04/30/2014 Last dose of adjuvant Herceptin. INTERIM HISTORYLast seen in clinic July 2023Osteoporosis, working with endocrinologist Dr. Edwena Felty ([place])Feeling well; no new symptoms. Lives in [place]  and spends winter in [place]. Breast imaging due in October, needs to schedule. Still with toenail fungus; requests referral to podiatry. Vaginal dryness: on Premarin.  No unusual aches and pains. Mood is good. Otherwise feeling well.Review of SystemsA comprehensive 12-point review of systems was queried and is otherwise negative for fevers, headaches, double vision, dizziness, cough, shortness of breath, chest pain, abdominal pain, nausea/vomiting, bowel changes,  urinary symptoms, swelling of extremities. No new onset of bone or joint pain.ECOG Performance Status: 0PAST MEDICAL HX: Nieshia  has a past medical history of Breast cancer (HC Code), Diverticulitis, and Radiation.PAST SURGICAL HX: She  has a past surgical history that includes Splenectomy, total (1963); Foot surgery; Sinus surgery; and Breast surgery (Left, 11/17/13).ALLERGIES: Perjeta [pertuzumab], Ciprofloxacin, and Flagyl [metronidazole]MEDICATIONS: Current Outpatient Medications Medication Sig  acetaminophen (TYLENOL) 325 MG tablet Take 650 mg by mouth every 6 (six) hours as needed. Start date December 2014  black cohosh 20 mg Tab Take by mouth.  CALCIUM CARB/VIT D3/MINERALS (CALCIUM-VITAMIN D ORAL) Take by mouth. Start date 2004  fluocinonide (LIDEX) 0.05 % external solution fluocinonide 0.05 % topical solution  PREMARIN 0.625 mg/gram Crea   XIIDRA 5 % Dpet  No current facility-administered medications for this visit. FAMILY HX: Her family history includes BRCA 1/2 in her sister; Breast cancer (age of onset: 26) in her paternal aunt and paternal grandmother; Prostate cancer (age of onset: 21) in her father; Uterine cancer (age of onset: 36) in her maternal grandmother.SOCIAL HX: Verlean  reports that she has never smoked. She has never used smokeless tobacco. She reports current alcohol use. She reports that she does not use drugs. 3-4 glasses of wine per week. PHYSICAL EXAM:BP: 117/76 (02/03/2023  9:31 AM), Pulse: 63 (02/03/2023  9:31 AM), Temp: 97.8 ?F (36.6 ?C) (02/03/2023  9:31 AM), Temp src: Temporal (02/03/2023  9:31 AM), Resp: 18 (02/03/2023  9:31 AM), SpO2: 97 % (02/03/2023  9:31 AM), Pain Score: Zero (02/03/2023  9:31 AM), Height: 5' 3.78 (1.62 m) (02/03/2023  9:31 AM), Weight: 57.7 kg (02/03/2023  9:31 AM), Weight (lbs): 127.1 (02/03/2023  9:31 AM), BMI (Calculated): 22 (02/03/2023  9:31 AM)General: Well appearing female in no acute distress.Oral mucosa is clear. Sclera anicteric. No palpable cervical, supraclavicular or infraclavicular lymph nodes.  Lungs: Clear to auscultation.Heart: Regular rate and rhythm.Abdomen: Soft and non tender without any appreciable organomegaly.Extremities: No swelling.Musculoskeletal: No tenderness to spine on palpation.Skin: No evidence of rash in exposed parts of the body.Psychiatric: She has a normal mood and affect. Breasts: No palpable axillary adenopathy.Left breast: No suspicious findings, lumpectomy scar without any suspicious findings. Right breast: No suspicious findings. Data Review:Recent ImagingMammography Screening Tomo Bilateral [facility] SR)04/22/2022 ([place]):IMPRESSION: Stable mammogram. Annual screening mammogram recommended. BREAST: Right RECOMMENDATION: Annual screening mammogram in 1 year BREAST: Left RECOMMENDATION: Annual screening mammogram in 1 year Pathology:11/17/13: Left partial mastectomy with left axillary dissection: No residual invasive or insitu cancer. 15 lymph nodes negative. Labs:No recent labs. ASSESSMENT/PLAN:#. Stage II HER2 positive, ER/PR negative breast cancer:  Presented with 2.1cm left axillary mass. Staging negative for metastatic disease. Completed neoadjuvant chemotherapy on protocol with Taxol, Herceptin, and  Pertuzumab followed by FEC/Herceptin (pertuzumab discontinued due to anaphylactic reaction), with complete pathologic complete response. She completed adjuvant Herceptin 04/2014. - No findings concerning for local or distant recurrence today. #. Breast imaging: Benign 04/2022, annual follow up due 04/2023 (previously scheduled through [facility])- Orders placed and she will call and schedule#. Osteoporosis: Met with endocrinologist, plan is to monitor this.  #. Symptom management:- Lifestyle: Remains active.  - Toenail fungus: Referred to podiatry.- Vaginal dryness/stinging: On premarin through GYN. #. Dermatology: Will schedule annual follow up. #. Social Support. Supportive partner,  Para March. Mood is good. - Lives in [place] half the year. #. Follow up: RTC 1 year Call sooner for questions or concerns. Reportable symptoms reviewed. On the day of this patient's encounter, a total of >20 minutes was personally spent by me.  No orders of the defined types were placed in this encounter.

## 2023-02-04 DIAGNOSIS — Z171 Estrogen receptor negative status [ER-]: Secondary | ICD-10-CM

## 2023-02-08 ENCOUNTER — Telehealth: Admit: 2023-02-08 | Payer: PRIVATE HEALTH INSURANCE | Attending: Medical Oncology

## 2023-02-08 NOTE — Telephone Encounter
Faxed mammo order to [facility] per pt request, received fax confirmation.  [phone].  Pt aware.

## 2023-02-08 NOTE — Telephone Encounter
Good morning [name] called today and she is asking for Dr. Girtha Rm to sent a direct order for a mammogram to [facility].She would like to receive a call back to talk about this issue.Thank you. [name]

## 2023-04-06 ENCOUNTER — Ambulatory Visit: Admit: 2023-04-06 | Payer: PRIVATE HEALTH INSURANCE | Attending: Foot & Ankle Surgery

## 2023-04-06 ENCOUNTER — Encounter: Admit: 2023-04-06 | Payer: PRIVATE HEALTH INSURANCE | Attending: Foot & Ankle Surgery

## 2023-04-06 DIAGNOSIS — IMO0001 Radiation: Secondary | ICD-10-CM

## 2023-04-06 DIAGNOSIS — B351 Tinea unguium: Secondary | ICD-10-CM

## 2023-04-06 DIAGNOSIS — K5792 Diverticulitis of intestine, part unspecified, without perforation or abscess without bleeding: Secondary | ICD-10-CM

## 2023-04-06 DIAGNOSIS — C50919 Malignant neoplasm of unspecified site of unspecified female breast: Secondary | ICD-10-CM

## 2023-04-07 DIAGNOSIS — Z853 Personal history of malignant neoplasm of breast: Secondary | ICD-10-CM

## 2023-04-07 DIAGNOSIS — Z803 Family history of malignant neoplasm of breast: Secondary | ICD-10-CM

## 2023-04-07 DIAGNOSIS — Z881 Allergy status to other antibiotic agents status: Secondary | ICD-10-CM

## 2023-04-07 DIAGNOSIS — Z888 Allergy status to other drugs, medicaments and biological substances status: Secondary | ICD-10-CM

## 2023-04-07 DIAGNOSIS — Z87891 Personal history of nicotine dependence: Secondary | ICD-10-CM

## 2023-04-09 NOTE — Progress Notes
 [facility] HospitalPodiatric Surgery	Outpatient Clinic NoteDate: 9/24/2024Patient Name: Sydney Crosby HinchMRN: [id number] Subjective: Sydney Pope is a 67 y.o. female with PMHx of breast cancer with radiation therapy presenting with bilateral dystrophic hallucal nails.  Patient states that she has had dystrophic nails for about a decade since she 1st started chemotherapy.  Patient states that she has trialed topical antifungals and noted no relief about 2 years ago.  Patient states that she has had intermittent bouts of her nails falling off on their own.  Patient presents today seeking antifungal treatment for her nails.  Denies fever nausea vomiting dizziness or shortness of breath.  Allergies: Perjeta [pertuzumab], Ciprofloxacin, and Flagyl [metronidazole]PMH: PSH: Past Medical History: Diagnosis Date  Breast cancer (HC Code)   left  Diverticulitis   Radiation   Past Surgical History: Procedure Laterality Date  BREAST SURGERY Left 11/17/13  partial mastectomy; axillary lymph node dissection (15 nodes removed)  FOOT SURGERY    SINUS SURGERY    SPLENECTOMY, TOTAL  1963  (age 72) trauma  Social History: Family History: Social History Tobacco Use  Smoking status: Never  Smokeless tobacco: Never  Tobacco comments:   smoked as a teenager Substance Use Topics  Alcohol use: Yes   Comment: 1-2  Family History Problem Relation Age of Onset  Prostate cancer Father 33  Breast cancer Paternal Aunt 63  Breast cancer Paternal Grandmother 4  BRCA 1/2 Sister       BRCA2 mutant  Uterine cancer Maternal Grandmother 40  ROS: Constitutional: Negative for chills and fever. HENT: Negative for hearing lossEyes: Negative for blurred visionRespiratory: Negative for cough Cardiovascular: Negative for leg swellingGastrointestinal: Negative for nausea. Genitourinary: Negative for dysuria Musculoskeletal: Negative for myalgias. Skin: Negative for itching and rash. Neurological: Negative for sensory changesPsychiatric/Behavioral: Negative for memory lossPhysical Exam: Vitals:  04/06/23 0950 BP: 121/79 Pulse: 63 Resp: 18 Temp: 97.1 ?F (36.2 ?C) Gen: NADPulm: breathing comfortably on RACards: rrAbd: softVascular: DP/PT pulses palpable. Normal CRT to all digits. Foot is warm to touch. Pedal hair growth is present.Neuro: Protective sensation is normalMSK: No gross deformities. No TTP.Derm: Normal texture and turgor.  Dystrophic bilateral hallucal nails.  With distal onycholysis noted.Imaging: No pertinent dataAssessment & Plan: Sydney Pope is a 67 y.o. female with bilateral dystrophic hallucal nails- Discussed diagnosis and treatment options in detail including obtaining a nail biopsy to differentiate dystrophic nail versus onychomycosis- hallucal nails trimmed and specimens sent to path- Ordered dermpath- Liver function enzymes evaluated and reviewed with the patient- RTC telemedicine to discuss pathology findings and possibly start antifungal treatmentSeen and discussed with attending, Dr. Franco Nones. Recommendations are subjected to change per attending's addendum. Gaylene Brooks Surgery PGY-2	Electronically Signed by Quay Burow, DPM, April 06, 2023  I saw and evaluated Sydney Pope with the resident. I was present and supervised the resident during the procedure. I agree with the findings and the plan of care as documented in the resident note.The patient presented for a nail evaluation due to concerns of possible tinea pedis or mycosis. She has a history of dystrophic nails for the past seven to ten years, which began after starting chemotherapy. Despite treatment with topical antifungals from another provider, the condition of the nails did not improve. The patient has experienced recurrent onycholysis, where the nail grows out and falls off, with no history of direct trauma to the nails. No direct sample of the nail has been taken for examination previously.The patient's nails have dark discoloration, which she believes is not due to sock fuzz as  it is always present. The nails are lifted and not well adhered, with visible black underneath. The patient has not experienced any pain, but finds the condition of the nails unsettling.The patient has previously tried a cream for a couple of years, which initially showed some success but eventually the condition recurred. The patient has had blood work done within the last six months through a different system, not through [facility].Description: Nail lifted up. Black material observed underneath, identified as likely sock fuzz. Specimen taken from right hallux nail and sent to [facility] for analysis.Dystrophic NailsChronic nail changes with onycholysis for the past 7-10 years since starting chemotherapy. Previous treatment with topical antifungals without improvement. Differential includes onychomycosis vs nail dystrophy.-Sent nail clipping for dermpath biopsy to confirm diagnosis.-Plan for telemdicine follow up-If positive for fungus, discuss treatment options including creams, lacquers, laser treatments, and oral medications.-If negative, discuss topical medications to improve nail appearance.Electronically Signed by Donn Pierini, DPM, April 06, 2023

## 2023-04-16 ENCOUNTER — Encounter: Admit: 2023-04-16 | Payer: Medicare (Managed Care) | Attending: Foot & Ankle Surgery

## 2023-04-16 ENCOUNTER — Encounter: Admit: 2023-04-16 | Payer: PRIVATE HEALTH INSURANCE | Attending: Foot & Ankle Surgery

## 2023-04-16 MED ORDER — UREA 40 % TOPICAL CREAM
40 | Freq: Every day | TOPICAL | 3 refills | 30.00000 days | Status: AC
Start: 2023-04-16 — End: ?

## 2023-04-26 ENCOUNTER — Encounter: Admit: 2023-04-26 | Payer: PRIVATE HEALTH INSURANCE | Attending: Vascular and Interventional Radiology

## 2023-04-30 ENCOUNTER — Telehealth: Admit: 2023-04-30 | Payer: PRIVATE HEALTH INSURANCE | Attending: Medical Oncology

## 2023-04-30 DIAGNOSIS — C50912 Malignant neoplasm of unspecified site of left female breast: Secondary | ICD-10-CM

## 2023-04-30 NOTE — Telephone Encounter
 Patient called please put order in for Ultra Sound per [facility]

## 2023-04-30 NOTE — Telephone Encounter
 Returned call to Ronae, we faxed orders for diagnostic mammo/US to [facility] radiology for re-evaluation of BIRADS-0 seen on 10/14 b/l mammogram imaging. Kam states she has the appointment for the imaging on 10/28 as she will be out of the [place] until then. Concerns addressed, patient was appreciative for the call. Electronically Signed by Waymond Cera, RN, April 30, 2023 12:15 PM

## 2023-05-10 ENCOUNTER — Encounter: Admit: 2023-05-10 | Payer: PRIVATE HEALTH INSURANCE | Attending: Vascular and Interventional Radiology

## 2023-05-12 ENCOUNTER — Encounter: Admit: 2023-05-12 | Payer: PRIVATE HEALTH INSURANCE

## 2023-05-12 ENCOUNTER — Encounter: Admit: 2023-05-12 | Payer: PRIVATE HEALTH INSURANCE | Attending: Oncology

## 2023-05-12 DIAGNOSIS — C50912 Malignant neoplasm of unspecified site of left female breast: Secondary | ICD-10-CM

## 2023-05-12 NOTE — Telephone Encounter
 Results reviewed US Breast Right Limited DiagnosticResult Date: 10/28/2024Probably benign parenchymal asymmetry upper outer quadrant right breast. Recommend short-interval 109-month diagnostic mammographic follow-up on the right. BREAST:  Right RECOMMENDATION:  Follow-up imaging in 6 months BI-RADS:  3, Probably benignMammography Diagnostic Tomo RightResult Date: 10/28/2024Probably benign parenchymal asymmetry upper outer quadrant right breast. Recommend short-interval 60-month diagnostic mammographic follow-up on the right. BREAST:  Right RECOMMENDATION:  Follow-up imaging in 6 months BI-RADS:  3, Probably benign 6 month diagnostic mammogram right breast ordered for external Orders Placed This Encounter Procedures  Mammography Diagnostic Tomo Right

## 2023-07-09 IMAGING — MR MRI RIGHT KNEE WITHOUT CONTRAST
4 of 5 series · 25 of 40 positions shown · IV contrast (gadolinium)
Comparison: Radiographs of 06/17/2023.

________________________________________________________________________________________________ 
MRI RIGHT KNEE WITHOUT CONTRAST, 07/09/2023 [DATE]: 
CLINICAL INDICATION: Pain in the right knee. Patient slipped off a wet stair in 
October. History of breast cancer with lumpectomy.
TECHNIQUE: Multiplanar, multiecho position MR images of the knee were performed 
without intravenous gadolinium enhancement. Patient was scanned on a
magnet.

[Series 301: PD fat-sat · axial · right · 3.0mm · 0.36mm/px · z∈[-64,+59]mm · 8 of 36 slices shown (1 of 3)]
[im 1/36]
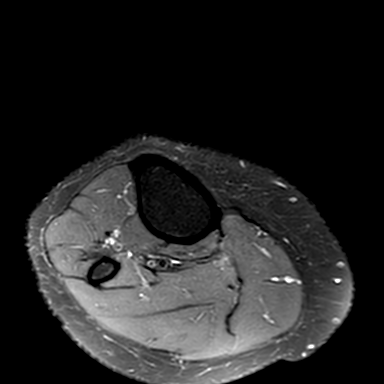
[im 4/36]
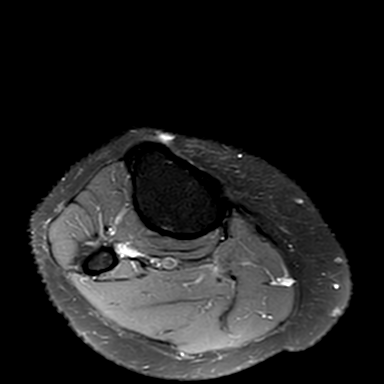
[im 12/36]
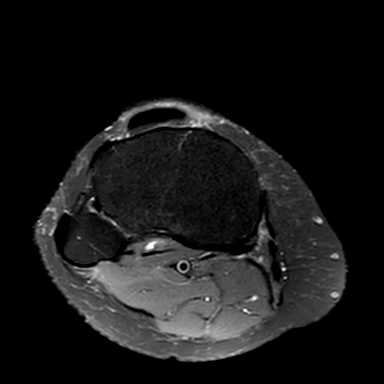
[im 16/36]
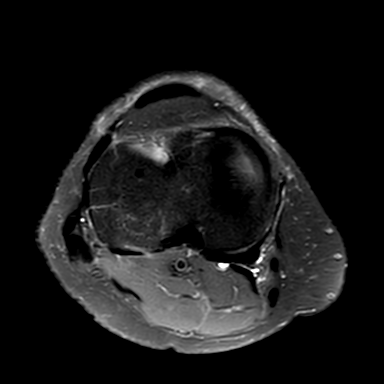
[im 20/36]
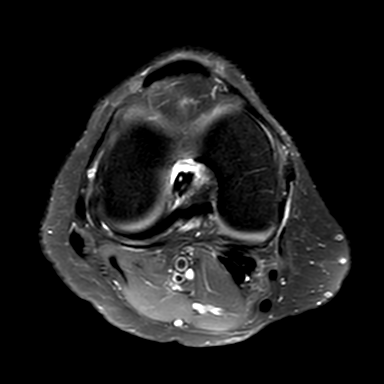
[im 24/36]
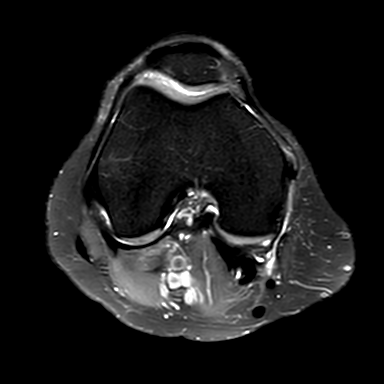
[im 32/36]
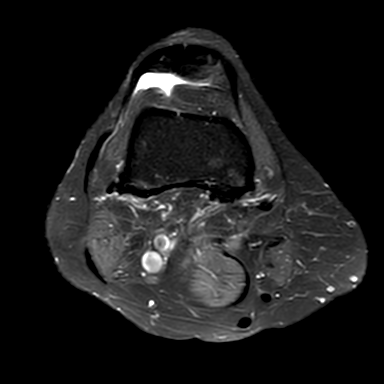
[im 36/36]
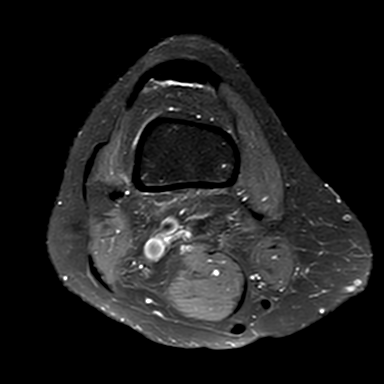

[Series 401: PD fat-sat · sagittal · right · 3.0mm · 0.29mm/px · 8 of 29 slices shown (2 of 3)]
[im 1/29]
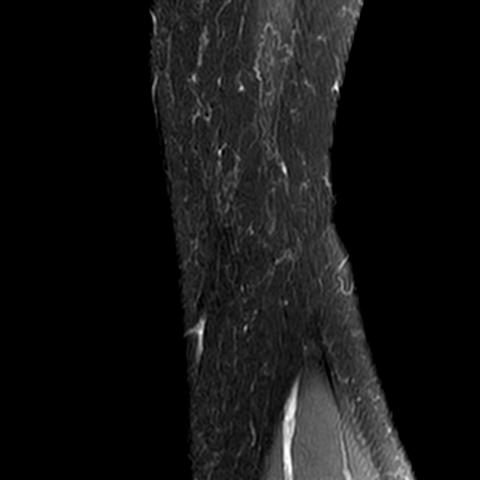
[im 5/29]
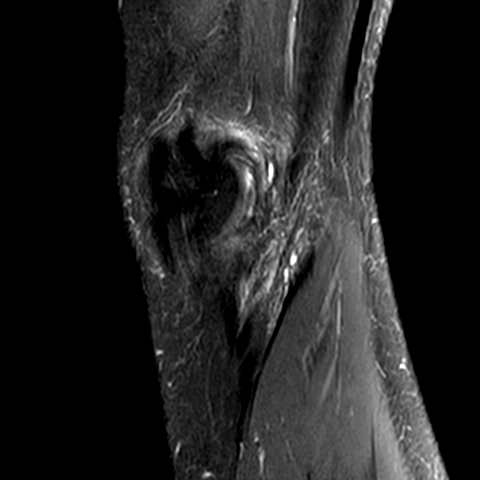
[im 9/29]
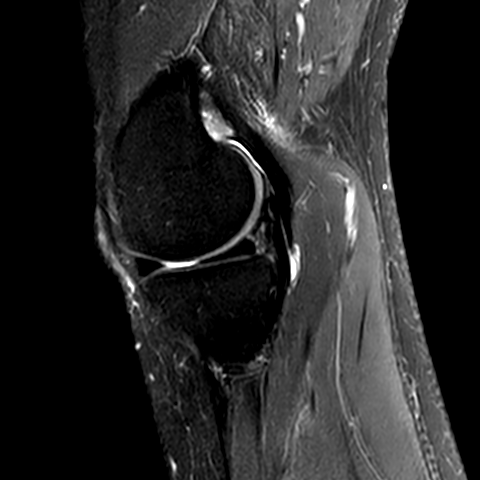
[im 13/29]
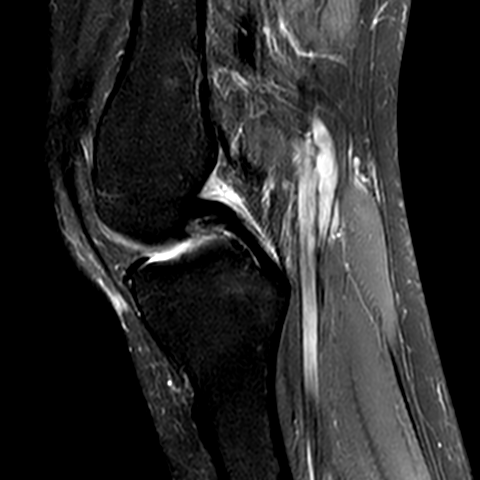
[im 17/29]
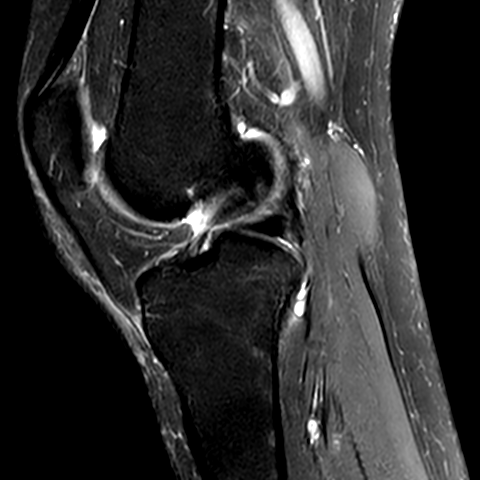
[im 21/29]
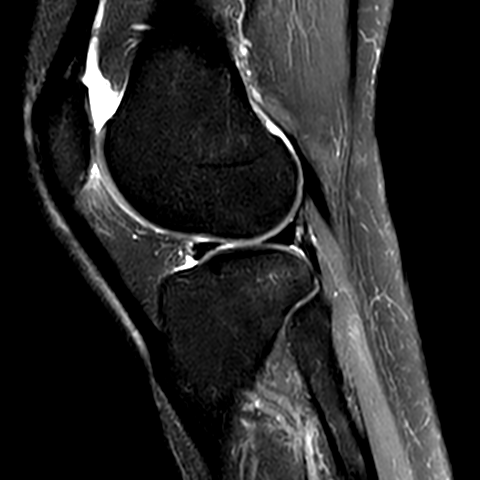
[im 25/29]
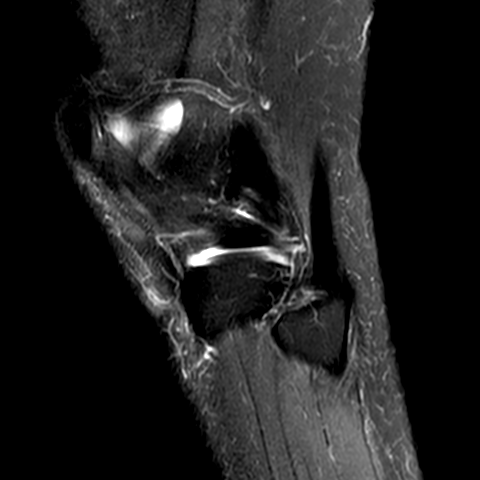
[im 29/29]
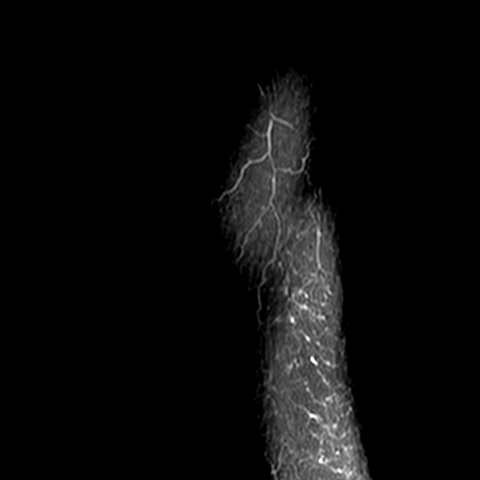

[Series 501: T1 · coronal · right · 3.0mm · 0.31mm/px · 3 of 32 slices shown]
[im 4/32]
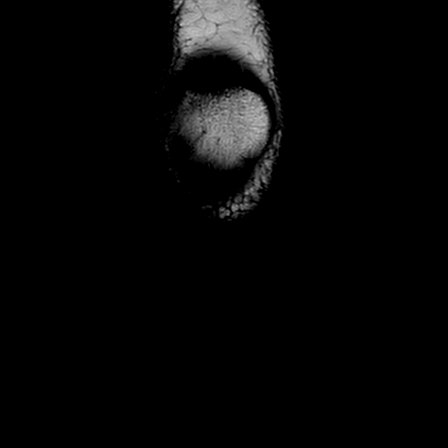
[im 16/32]
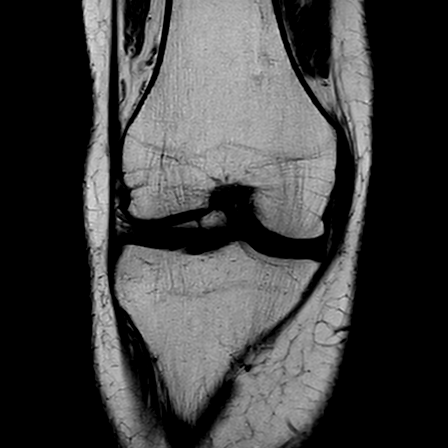
[im 28/32]
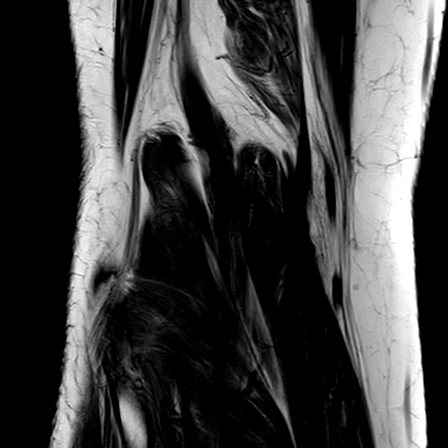

[Series 601: PD fat-sat · coronal · right · 3.0mm · 0.31mm/px · 6 of 32 slices shown (3 of 3)]
[im 1/32]
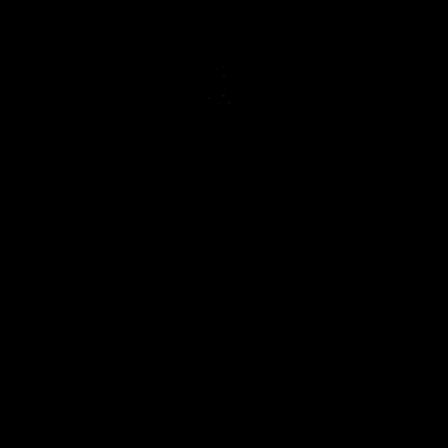
[im 4/32]
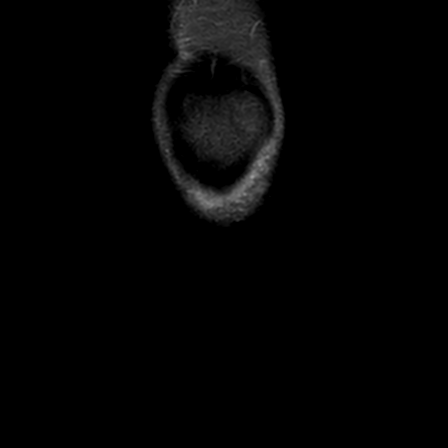
[im 8/32]
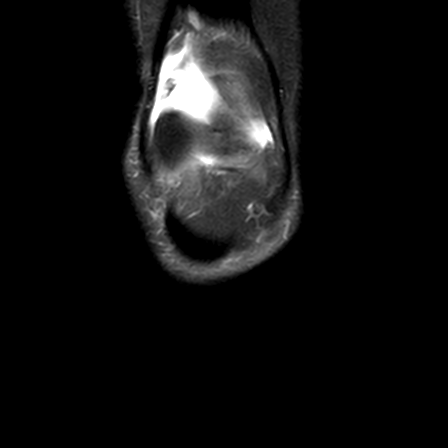
[im 12/32]
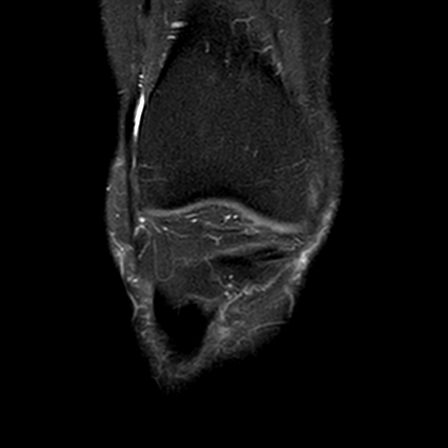
[im 16/32]
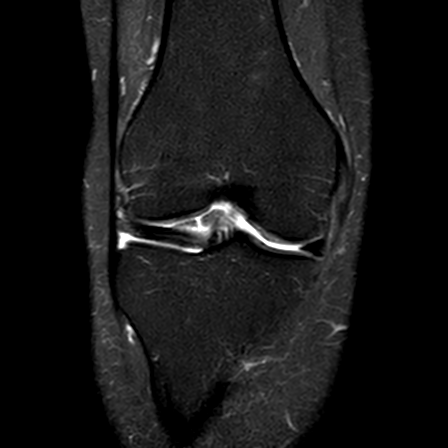
[im 28/32]
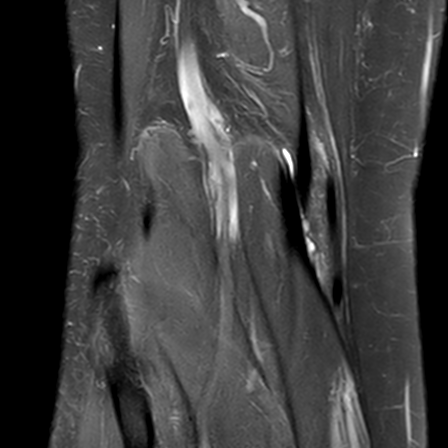

[25 of 40 positions shown; findings below may reference images not displayed]

FINDINGS: MEDIAL COMPARTMENT: There is mild intrasubstance degenerative signal intensity 
within the medial meniscus without focal tear. Mild to moderate articular 
cartilaginous loss, grade 2 to grade 3. 
LATERAL COMPARTMENT: The lateral meniscus is intact without tear or extrusion. 
Mild articular cartilaginous loss, grade 2. 
PATELLOFEMORAL COMPARTMENT: The patella is centrally located. Moderate articular 
cartilaginous loss, grade 3. 
TIBIOFIBULAR COMPARTMENT: Negative. 
LIGAMENTS: The anterior cruciate ligament is intact. The posterior cruciate 
ligament is intact. Mild thickening and intrasubstance signal abnormality with 
low-grade partial tearing of the proximal medial collateral ligament. The MCL 
maintains a taut configuration. The lateral collateral ligament is intact. 
EXTENSOR MECHANISM: The quadriceps and patellar tendon are preserved. The medial 
and lateral retinacula are intact. 
POSTEROMEDIAL CORNER: The semimembranosus and pes anserine tendons are 
preserved. The posterior oblique ligament and posterior medial joint capsule are 
intact. 
POSTEROLATERAL CORNER: The popliteal tendon and popliteofibular ligament are 
intact. The biceps femoris is negative. 
BONES: Normal bone marrow signal intensity. No fracture or contusion or stress 
response.  
ADDITIONAL FINDINGS: Trace knee joint effusion. Trace fluid within the 
semitendinosus/medial head of the gastrocnemius bursa. The musculature is normal 
without mass, signal abnormality or atrophy. Neurovascular bundles are negative. 
Subcutaneous tissues are negative.
IMPRESSION: 1.  Low-grade partial tearing of the proximal MCL. 
2.  Tricompartmental mild-to-moderate degenerative changes. 
3.  Mild intrasubstance degenerative signal intensity within the medial meniscus 
without focal tear.

## 2023-11-11 ENCOUNTER — Ambulatory Visit: Admit: 2023-11-11 | Payer: PRIVATE HEALTH INSURANCE

## 2023-11-24 ENCOUNTER — Encounter: Admit: 2023-11-24 | Payer: PRIVATE HEALTH INSURANCE | Attending: Oncology

## 2023-11-24 ENCOUNTER — Inpatient Hospital Stay: Admit: 2023-11-24 | Payer: Medicare (Managed Care)

## 2023-11-24 ENCOUNTER — Inpatient Hospital Stay: Admit: 2023-11-24 | Payer: PRIVATE HEALTH INSURANCE

## 2023-11-24 ENCOUNTER — Inpatient Hospital Stay: Admit: 2023-11-24 | Discharge: 2023-11-24 | Payer: Medicare (Managed Care)

## 2023-11-24 DIAGNOSIS — C50912 Malignant neoplasm of unspecified site of left female breast: Secondary | ICD-10-CM

## 2023-12-03 ENCOUNTER — Encounter: Admit: 2023-12-03 | Payer: PRIVATE HEALTH INSURANCE | Attending: Oncology

## 2023-12-03 ENCOUNTER — Ambulatory Visit: Admit: 2023-12-03 | Payer: PRIVATE HEALTH INSURANCE

## 2023-12-03 ENCOUNTER — Inpatient Hospital Stay: Admit: 2023-12-03 | Discharge: 2023-12-03 | Payer: PRIVATE HEALTH INSURANCE

## 2023-12-03 DIAGNOSIS — C50912 Malignant neoplasm of unspecified site of left female breast: Secondary | ICD-10-CM

## 2023-12-09 ENCOUNTER — Encounter: Admit: 2023-12-09 | Payer: PRIVATE HEALTH INSURANCE | Attending: Oncology

## 2023-12-09 DIAGNOSIS — C50912 Malignant neoplasm of unspecified site of left female breast: Secondary | ICD-10-CM

## 2024-01-05 ENCOUNTER — Telehealth: Admit: 2024-01-05 | Payer: PRIVATE HEALTH INSURANCE

## 2024-01-05 NOTE — Telephone Encounter
 I spoke to the patient on her Mammo appointment for next year on 05/10/25 at 12:00 pm in the [place] location.

## 2024-02-09 ENCOUNTER — Encounter: Admit: 2024-02-09 | Payer: PRIVATE HEALTH INSURANCE | Attending: Medical Oncology

## 2024-02-09 ENCOUNTER — Ambulatory Visit: Admit: 2024-02-09 | Payer: PRIVATE HEALTH INSURANCE | Attending: Medical Oncology

## 2024-02-09 VITALS — BP 118/71 | HR 61 | Temp 97.20000°F | Resp 17 | Ht 62.677 in | Wt 123.0 lb

## 2024-02-09 DIAGNOSIS — C50912 Malignant neoplasm of unspecified site of left female breast: Principal | ICD-10-CM

## 2024-02-09 DIAGNOSIS — IMO0001 Radiation: Secondary | ICD-10-CM

## 2024-02-09 DIAGNOSIS — C50919 Malignant neoplasm of unspecified site of unspecified female breast: Principal | ICD-10-CM

## 2024-02-09 DIAGNOSIS — K5792 Diverticulitis of intestine, part unspecified, without perforation or abscess without bleeding: Secondary | ICD-10-CM
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
## 2024-04-26 ENCOUNTER — Encounter: Admit: 2024-04-26 | Payer: PRIVATE HEALTH INSURANCE | Attending: Vascular and Interventional Radiology

## 2025-02-07 ENCOUNTER — Ambulatory Visit: Admit: 2025-02-07 | Payer: PRIVATE HEALTH INSURANCE | Attending: Medical Oncology
